# Patient Record
Sex: Male | Born: 1950 | Race: White | Hispanic: No | Marital: Married | Smoking: Never smoker
Health system: Southern US, Community
[De-identification: ages and names within clinical notes are randomized; demographics above are authoritative.]

## PROBLEM LIST (undated history)

## (undated) DIAGNOSIS — I10 Essential (primary) hypertension: Secondary | ICD-10-CM

## (undated) DIAGNOSIS — E7800 Pure hypercholesterolemia, unspecified: Secondary | ICD-10-CM

## (undated) HISTORY — PX: TONSILLECTOMY: SUR1361

---

## 2001-09-27 ENCOUNTER — Ambulatory Visit (HOSPITAL_BASED_OUTPATIENT_CLINIC_OR_DEPARTMENT_OTHER): Admission: RE | Admit: 2001-09-27 | Discharge: 2001-09-27 | Payer: Self-pay | Admitting: Plastic Surgery

## 2015-04-28 ENCOUNTER — Emergency Department (HOSPITAL_BASED_OUTPATIENT_CLINIC_OR_DEPARTMENT_OTHER)
Admission: EM | Admit: 2015-04-28 | Discharge: 2015-04-28 | Disposition: A | Discharge status: Home / Self Care | Attending: Emergency Medicine | Admitting: Emergency Medicine

## 2015-04-28 ENCOUNTER — Encounter (HOSPITAL_BASED_OUTPATIENT_CLINIC_OR_DEPARTMENT_OTHER): Payer: Self-pay

## 2015-04-28 DIAGNOSIS — I10 Essential (primary) hypertension: Secondary | ICD-10-CM | POA: Insufficient documentation

## 2015-04-28 DIAGNOSIS — S60512A Abrasion of left hand, initial encounter: Secondary | ICD-10-CM | POA: Insufficient documentation

## 2015-04-28 DIAGNOSIS — S4992XA Unspecified injury of left shoulder and upper arm, initial encounter: Secondary | ICD-10-CM | POA: Insufficient documentation

## 2015-04-28 DIAGNOSIS — Y998 Other external cause status: Secondary | ICD-10-CM | POA: Diagnosis not present

## 2015-04-28 DIAGNOSIS — M25512 Pain in left shoulder: Secondary | ICD-10-CM

## 2015-04-28 DIAGNOSIS — S6992XA Unspecified injury of left wrist, hand and finger(s), initial encounter: Secondary | ICD-10-CM | POA: Diagnosis present

## 2015-04-28 DIAGNOSIS — Y92008 Other place in unspecified non-institutional (private) residence as the place of occurrence of the external cause: Secondary | ICD-10-CM | POA: Diagnosis not present

## 2015-04-28 DIAGNOSIS — W1789XA Other fall from one level to another, initial encounter: Secondary | ICD-10-CM | POA: Diagnosis not present

## 2015-04-28 DIAGNOSIS — Y9389 Activity, other specified: Secondary | ICD-10-CM | POA: Insufficient documentation

## 2015-04-28 DIAGNOSIS — E780 Pure hypercholesterolemia: Secondary | ICD-10-CM | POA: Diagnosis not present

## 2015-04-28 HISTORY — DX: Essential (primary) hypertension: I10

## 2015-04-28 HISTORY — DX: Pure hypercholesterolemia, unspecified: E78.00

## 2015-04-28 NOTE — Discharge Instructions (Signed)
You may take the celebrex you have at home for your pain. Apply ice to your shoulder. Keep hand wound clean and dry.  Abrasion An abrasion is a cut or scrape of the skin. Abrasions do not extend through all layers of the skin and most heal within 10 days. It is important to care for your abrasion properly to prevent infection. CAUSES  Most abrasions are caused by falling on, or gliding across, the ground or other surface. When your skin rubs on something, the outer and inner layer of skin rubs off, causing an abrasion. DIAGNOSIS  Your caregiver will be able to diagnose an abrasion during a physical exam.  TREATMENT  Your treatment depends on how large and deep the abrasion is. Generally, your abrasion will be cleaned with water and a mild soap to remove any dirt or debris. An antibiotic ointment may be put over the abrasion to prevent an infection. A bandage (dressing) may be wrapped around the abrasion to keep it from getting dirty.  You may need a tetanus shot if:  You cannot remember when you had your last tetanus shot.  You have never had a tetanus shot.  The injury broke your skin. If you get a tetanus shot, your arm may swell, get red, and feel warm to the touch. This is common and not a problem. If you need a tetanus shot and you choose not to have one, there is a rare chance of getting tetanus. Sickness from tetanus can be serious.  HOME CARE INSTRUCTIONS   If a dressing was applied, change it at least once a day or as directed by your caregiver. If the bandage sticks, soak it off with warm water.   Wash the area with water and a mild soap to remove all the ointment 2 times a day. Rinse off the soap and pat the area dry with a clean towel.   Reapply any ointment as directed by your caregiver. This will help prevent infection and keep the bandage from sticking. Use gauze over the wound and under the dressing to help keep the bandage from sticking.   Change your dressing right away  if it becomes wet or dirty.   Only take over-the-counter or prescription medicines for pain, discomfort, or fever as directed by your caregiver.   Follow up with your caregiver within 24-48 hours for a wound check, or as directed. If you were not given a wound-check appointment, look closely at your abrasion for redness, swelling, or pus. These are signs of infection. SEEK IMMEDIATE MEDICAL CARE IF:   You have increasing pain in the wound.   You have redness, swelling, or tenderness around the wound.   You have pus coming from the wound.   You have a fever or persistent symptoms for more than 2-3 days.  You have a fever and your symptoms suddenly get worse.  You have a bad smell coming from the wound or dressing.  MAKE SURE YOU:   Understand these instructions.  Will watch your condition.  Will get help right away if you are not doing well or get worse. Document Released: 08/30/2005 Document Revised: 11/06/2012 Document Reviewed: 10/24/2011 [facility] Patient Information 2015 [place]. This information is not intended to replace advice given to you by your health care provider. Make sure you discuss any questions you have with your health care provider.   Shoulder Pain The shoulder is the joint that connects your arms to your body. The bones that form the shoulder  joint include the upper arm bone (humerus), the shoulder blade (scapula), and the collarbone (clavicle). The top of the humerus is shaped like a ball and fits into a rather flat socket on the scapula (glenoid cavity). A combination of muscles and strong, fibrous tissues that connect muscles to bones (tendons) support your shoulder joint and hold the ball in the socket. Small, fluid-filled sacs (bursae) are located in different areas of the joint. They act as cushions between the bones and the overlying soft tissues and help reduce friction between the gliding tendons and the bone as you move your arm. Your shoulder  joint allows a wide range of motion in your arm. This range of motion allows you to do things like scratch your back or throw a ball. However, this range of motion also makes your shoulder more prone to pain from overuse and injury. Causes of shoulder pain can originate from both injury and overuse and usually can be grouped in the following four categories:  Redness, swelling, and pain (inflammation) of the tendon (tendinitis) or the bursae (bursitis).  Instability, such as a dislocation of the joint.  Inflammation of the joint (arthritis).  Broken bone (fracture). HOME CARE INSTRUCTIONS   Apply ice to the sore area.  Put ice in a plastic bag.  Place a towel between your skin and the bag.  Leave the ice on for 15-20 minutes, 3-4 times per day for the first 2 days, or as directed by your health care provider.  Stop using cold packs if they do not help with the pain.  If you have a shoulder sling or immobilizer, wear it as long as your caregiver instructs. Only remove it to shower or bathe. Move your arm as little as possible, but keep your hand moving to prevent swelling.  Squeeze a soft ball or foam pad as much as possible to help prevent swelling.  Only take over-the-counter or prescription medicines for pain, discomfort, or fever as directed by your caregiver. SEEK MEDICAL CARE IF:   Your shoulder pain increases, or new pain develops in your arm, hand, or fingers.  Your hand or fingers become cold and numb.  Your pain is not relieved with medicines. SEEK IMMEDIATE MEDICAL CARE IF:   Your arm, hand, or fingers are numb or tingling.  Your arm, hand, or fingers are significantly swollen or turn white or blue. MAKE SURE YOU:   Understand these instructions.  Will watch your condition.  Will get help right away if you are not doing well or get worse. Document Released: 08/30/2005 Document Revised: 04/06/2014 Document Reviewed: 11/04/2011 [facility] Patient Information  2015 [place], [place]. This information is not intended to replace advice given to you by your health care provider. Make sure you discuss any questions you have with your health care provider.

## 2015-04-28 NOTE — ED Provider Notes (Signed)
CSN: [id number]     Arrival date & time 04/28/15  1452 History   First MD Initiated Contact with Patient 04/28/15 1500     Chief Complaint  Patient presents with  . Fall     (Consider location/radiation/quality/duration/timing/severity/associated sxs/prior Treatment) HPI Comments: 64 year old male complaining of left hand and left shoulder pain after falling 3 feet off of a porch about an hour and a half prior to arrival. Patient reports there is no railing on the porch, and he missed a step and accidentally fell. No head injury or loss of consciousness. States he fell onto gravel and needs his hand cleaned out. Other than the scrapes on his hand, denies hand pain. Shoulder pain is described as a soreness, "deep into his shoulder", 1/10 without movement, only mildly increasing with movement. He took a Celebrex at home with some relief of his pain. Denies numbness or tingling. No prior injury to the left shoulder. Reports an injury 15 years ago to his right shoulder. Last tetanus shot was within the past 5 years.  Patient is a 64 y.o. male presenting with fall. The history is provided by the patient.  Fall Pertinent negatives include no chest pain, fever or numbness.    Past Medical History  Diagnosis Date  . High cholesterol   . Hypertension    Past Surgical History  Procedure Laterality Date  . Tonsillectomy     No family history on file. History  Substance Use Topics  . Smoking status: Never Smoker   . Smokeless tobacco: Not on file  . Alcohol Use: No    Review of Systems  Constitutional: Negative for fever.  HENT: Negative.   Respiratory: Negative for shortness of breath.   Cardiovascular: Negative for chest pain.  Musculoskeletal:       + L shoulder pain.  Skin: Positive for wound.  Neurological: Negative for dizziness and numbness.      Allergies  Review of patient's allergies indicates no known allergies.  Home Medications   Prior to Admission medications    Medication Sig Start Date End Date Taking? Authorizing Provider  Nebivolol HCl (BYSTOLIC PO) Take by mouth.   Yes Historical Provider, MD  SIMVASTATIN PO Take by mouth.   Yes Historical Provider, MD   BP 131/75 mmHg  Pulse 71  Temp(Src) 97.9 F (36.6 C)  Resp 18  Ht 5\' 10"  (1.778 m)  Wt 165 lb (74.844 kg)  BMI 23.68 kg/m2  SpO2 97% Physical Exam  Constitutional: He is oriented to person, place, and time. He appears well-developed and well-nourished. No distress.  HENT:  Head: Normocephalic and atraumatic.  Eyes: Conjunctivae and EOM are normal.  Neck: Normal range of motion. Neck supple.  Cardiovascular: Normal rate, regular rhythm and normal heart sounds.   Pulmonary/Chest: Effort normal and breath sounds normal.  Musculoskeletal: Normal range of motion. He exhibits no edema.  L shoulder non-tender. No swelling or deformity. FROM without pain. Positive empty can sign. L hand with abrasions to palmar surface. No active bleeding. No FB seen or palpated. No tenderness. FROM L hand. Cap refill < 2 seconds. L wrist non-tender.  Neurological: He is alert and oriented to person, place, and time.  Skin: Skin is warm and dry.  Psychiatric: He has a normal mood and affect. His behavior is normal.  Nursing note and vitals reviewed.   ED Course  Procedures (including critical care time) Labs Review Labs Reviewed - No data to display  Imaging Review No results found.  EKG Interpretation None      MDM   Final diagnoses:  Hand abrasion, left, initial encounter  Left shoulder pain   Neurovascularly intact. No FB seen or palpated. No bony tenderness. I do not feel imaging studies necessary at this time. FROM. Possible rotator cuff injury or inflammation. Has FROM shoulder and hand without pain. Advised him to continue celebrex, ice and f/u with PCP and/or sports medicine. Stable for d/c. Return precautions given. Patient states understanding of treatment care plan and is  agreeable.  Kathrynn Speed, PA-C 04/28/15 1527  Zadie Rhine, MD 04/29/15 2229

## 2015-04-28 NOTE — ED Notes (Signed)
Fell off porch today-pain to left shoulder and left hand

## 2015-07-21 ENCOUNTER — Ambulatory Visit: Attending: Orthopaedic Surgery | Admitting: Physical Therapy

## 2015-07-21 ENCOUNTER — Encounter: Payer: Self-pay | Admitting: Physical Therapy

## 2015-07-21 DIAGNOSIS — M25612 Stiffness of left shoulder, not elsewhere classified: Secondary | ICD-10-CM | POA: Diagnosis present

## 2015-07-21 DIAGNOSIS — M25512 Pain in left shoulder: Secondary | ICD-10-CM

## 2015-07-21 NOTE — Therapy (Signed)
[facility] [address] [place], [place], [address] Phone: [phone]   Fax:  [phone]  Physical Therapy Evaluation  Patient Details  Name: Arthur Waters MRN: [id number] Date of Birth: [birth year] Referring Provider:  Valeria Batman, MD  Encounter Date: 07/21/2015      PT End of Session - 07/21/15 1005    Visit Number 1   PT Start Time [phone]   PT Stop Time 0930   PT Time Calculation (min) 48 min   Activity Tolerance Patient tolerated treatment well   Behavior During Therapy [facility] for tasks assessed/performed      Past Medical History  Diagnosis Date  . High cholesterol   . Hypertension     Past Surgical History  Procedure Laterality Date  . Tonsillectomy      There were no vitals filed for this visit.  Visit Diagnosis:  Left shoulder pain - Plan: PT plan of care cert/re-cert  Shoulder stiffness, left - Plan: PT plan of care cert/re-cert      Subjective Assessment - 07/21/15 1000    Subjective reports a fall May 25th, injured his left shoulder, x-rays negative.  Had a cortisone injection and overall has less pain but still pain and limited ROM   Patient Stated Goals limits left shoulder use due to pain   Currently in Pain? Yes   Pain Score 3    Pain Location Shoulder   Pain Orientation Left;Anterior   Pain Descriptors / Indicators Aching;Sharp   Pain Type Acute pain   Pain Onset More than a month ago   Pain Frequency Intermittent   Aggravating Factors  push ups and behind the head activity   Pain Relieving Factors rest   Effect of Pain on Daily Activities limits left shoulder use            [facility] PT Assessment - 07/21/15 0001    Assessment   Medical Diagnosis left shoulder impingement   Onset Date/Surgical Date 04/28/15   Hand Dominance Right   Precautions   Precautions None   Balance Screen   Has the patient fallen in the past 6 months No   Has the patient had a decrease in  activity level because of a fear of falling?  No   Is the patient reluctant to leave their home because of a fear of falling?  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, has to do pushups often   Posture/Postural Control   Posture Comments good, slight rounded shoulders   AROM   Left Shoulder Flexion 140 Degrees   Left Shoulder ABduction 115 Degrees   Left Shoulder Internal Rotation 65 Degrees   Left Shoulder External Rotation 15 Degrees   Strength   Overall Strength Comments 4+/5 with minimal pain   Palpation   Palpation comment mild tenderness anterior /lateral left shoulder   Special Tests    Special Tests --  negative empty can, mild pain iwth impingement tests                           PT Education - 07/21/15 1004    Education provided Yes   Education Details corner stretch, star gazer stretch, went over safe use of equipment at gym as he was doing behind the head exercises   Person(s) Educated Patient   Methods  Explanation;Demonstration;Handout   Comprehension Verbalized understanding;Returned demonstration          PT Short Term Goals - 07/21/15 1221    PT SHORT TERM GOAL #1   Title independent with initial HEP   Time 1   Period Days   Status Achieved           PT Long Term Goals - 07/21/15 1222    PT LONG TERM GOAL #1   Title decrease pain to no pain with ADL's   Time 8   Period Weeks   Status New   PT LONG TERM GOAL #2   Title increase abduction to 130 degrees and ER to 60 degrees   Time 8   Period Weeks   Status New               Plan - 07/21/15 1219    Clinical Impression Statement Patient with a fall onto the shoulder in May, reports pain is better but he is missing ROM and having some increased difficulty with ADL's, x-rays negative.  Very tight, limited ER and abduction.  Good strength   Pt will benefit from  skilled therapeutic intervention in order to improve on the following deficits Decreased range of motion;Decreased strength;Impaired UE functional use;Pain   Rehab Potential Good   PT Frequency 1x / week   PT Duration 8 weeks   PT Treatment/Interventions Cryotherapy;Electrical Stimulation;Iontophoresis /ml Dexamethasone;Moist Heat;Ultrasound;Therapeutic exercise;Therapeutic activities;Patient/family education;Manual techniques   PT Next Visit Plan Patient is very active and travels, he is to try the HEP, may return in 1 month   Consulted and Agree with Plan of Care Patient         Problem List There are no active problems to display for this patient.   Jearld Lesch., PT 07/21/2015, 12:24 PM  [facility] [address] [place], [place], [address] Phone: [phone]   Fax:  [phone]

## 2016-09-24 ENCOUNTER — Emergency Department (HOSPITAL_BASED_OUTPATIENT_CLINIC_OR_DEPARTMENT_OTHER)

## 2016-09-24 ENCOUNTER — Encounter (HOSPITAL_BASED_OUTPATIENT_CLINIC_OR_DEPARTMENT_OTHER): Payer: Self-pay | Admitting: Emergency Medicine

## 2016-09-24 ENCOUNTER — Emergency Department (HOSPITAL_BASED_OUTPATIENT_CLINIC_OR_DEPARTMENT_OTHER)
Admission: EM | Admit: 2016-09-24 | Discharge: 2016-09-24 | Disposition: A | Discharge status: Home / Self Care | Attending: Emergency Medicine | Admitting: Emergency Medicine

## 2016-09-24 DIAGNOSIS — I10 Essential (primary) hypertension: Secondary | ICD-10-CM | POA: Diagnosis not present

## 2016-09-24 DIAGNOSIS — Z79899 Other long term (current) drug therapy: Secondary | ICD-10-CM | POA: Diagnosis not present

## 2016-09-24 DIAGNOSIS — I824Z1 Acute embolism and thrombosis of unspecified deep veins of right distal lower extremity: Secondary | ICD-10-CM

## 2016-09-24 DIAGNOSIS — I82401 Acute embolism and thrombosis of unspecified deep veins of right lower extremity: Secondary | ICD-10-CM | POA: Insufficient documentation

## 2016-09-24 DIAGNOSIS — M79661 Pain in right lower leg: Secondary | ICD-10-CM | POA: Diagnosis present

## 2016-09-24 LAB — COMPREHENSIVE METABOLIC PANEL
ALK PHOS: 58 U/L (ref 38–126)
ALT: 18 U/L (ref 17–63)
AST: 21 U/L (ref 15–41)
Albumin: 4.5 g/dL (ref 3.5–5.0)
Anion gap: 10 (ref 5–15)
BILIRUBIN TOTAL: 0.4 mg/dL (ref 0.3–1.2)
BUN: 22 mg/dL — ABNORMAL HIGH (ref 6–20)
CO2: 26 mmol/L (ref 22–32)
CREATININE: 1.14 mg/dL (ref 0.61–1.24)
Calcium: 9.4 mg/dL (ref 8.9–10.3)
Chloride: 104 mmol/L (ref 101–111)
GFR calc non Af Amer: >60 mL/min (ref >60)
Glucose, Bld: 99 mg/dL (ref 65–99)
Potassium: 3.5 mmol/L (ref 3.5–5.1)
SODIUM: 140 mmol/L (ref 135–145)
Total Protein: 7.6 g/dL (ref 6.5–8.1)

## 2016-09-24 LAB — CBC WITH DIFFERENTIAL/PLATELET
Basophils Absolute: 0 10*3/uL (ref 0.0–0.1)
Basophils Relative: 1 %
EOS ABS: 0.2 10*3/uL (ref 0.0–0.7)
Eosinophils Relative: 2 %
HEMATOCRIT: 40.7 % (ref 39.0–52.0)
HEMOGLOBIN: 13.8 g/dL (ref 13.0–17.0)
LYMPHS ABS: 1.4 10*3/uL (ref 0.7–4.0)
LYMPHS PCT: 21 %
MCH: 30.8 pg (ref 26.0–34.0)
MCHC: 33.9 g/dL (ref 30.0–36.0)
MCV: 90.8 fL (ref 78.0–100.0)
Monocytes Absolute: 0.9 10*3/uL (ref 0.1–1.0)
Monocytes Relative: 14 %
NEUTROS ABS: 4.1 10*3/uL (ref 1.7–7.7)
NEUTROS PCT: 62 %
Platelets: 164 10*3/uL (ref 150–400)
RBC: 4.48 MIL/uL (ref 4.22–5.81)
RDW: 13.1 % (ref 11.5–15.5)
WBC: 6.6 10*3/uL (ref 4.0–10.5)

## 2016-09-24 MED ORDER — RIVAROXABAN (XARELTO) VTE STARTER PACK (15 & 20 MG): 15.0000 mg | ORAL_TABLET | ORAL | 0 refills | Status: AC

## 2016-09-24 MED ORDER — RIVAROXABAN (XARELTO) VTE STARTER PACK (15 & 20 MG): 15.0000 mg | ORAL_TABLET | ORAL | 0 refills | Status: DC

## 2016-09-24 NOTE — ED Provider Notes (Addendum)
[facility] Provider Note   CSN: [id number] Arrival date & time: 09/24/16  1633  By signing my name below, I, Arthur Waters, attest that this documentation has been prepared under the direction and in the presence of Alvira MondayErin Aydien Majette, MD . Electronically Signed: Teofilo PodMatthew P. Waters, ED Scribe. 09/24/2016. 6:00 PM.   History   Chief Complaint Chief Complaint  Patient presents with  . Leg Pain    The history is provided by the patient. No language interpreter was used.   HPI Comments:  Arthur Waters is a 65 y.o. male with PMHx of HLD and HTN who presents to the Emergency Department complaining of constant right foot and lower leg pain x 1 week. Pt states that 1 week ago [place] and noticed that his right foot was tender after the flight. Pt rates the pain at 2/10. Pt notes that the pain is alleviated by taking hot showers. No other alleviating factors noted. Pt denies chest pain, SOB, fever. No hx of DVT, recent surgeries.   Past Medical History:  Diagnosis Date  . High cholesterol   . Hypertension     There are no active problems to display for this patient.   Past Surgical History:  Procedure Laterality Date  . TONSILLECTOMY         Home Medications    Prior to Admission medications   Medication Sig Start Date End Date Taking? Authorizing Provider  Nebivolol HCl (BYSTOLIC PO) Take by mouth.    Historical Provider, MD  Rivaroxaban 15 & 20 MG TBPK Take 15 mg by mouth See admin instructions. Take as directed on package: Start with one 15mg  tablet by mouth twice a day with food. On Day 22, switch to one 20mg  tablet once a day with food. 09/24/16   Alvira MondayErin Catheleen Langhorne, MD  SIMVASTATIN PO Take by mouth.    Historical Provider, MD    Family History History reviewed. No pertinent family history.  Social History Social History  Substance Use Topics  . Smoking status: Never Smoker  . Smokeless tobacco: Not on file    . Alcohol use No     Allergies   Review of patient's allergies indicates no known allergies.   Review of Systems Review of Systems  Constitutional: Negative for fever.  HENT: Negative for sore throat.   Eyes: Negative for visual disturbance.  Respiratory: Negative for shortness of breath.   Cardiovascular: Negative for chest pain.  Gastrointestinal: Negative for abdominal pain.  Genitourinary: Negative for difficulty urinating.  Musculoskeletal: Positive for arthralgias and myalgias. Negative for back pain and neck stiffness.  Skin: Negative for rash.  Neurological: Negative for syncope and headaches.  All other systems reviewed and are negative.    Physical Exam Updated Vital Signs BP 140/69 (BP Location: Right Arm)   Pulse 68   Temp 98.5 F (36.9 C) (Oral)   Resp 18   Ht 5\' 11"  (1.803 m)   Wt 175 lb (79.4 kg)   SpO2 97%   BMI 24.41 kg/m   Physical Exam  Constitutional: He is oriented to person, place, and time. He appears well-developed and well-nourished. No distress.  HENT:  Head: Normocephalic and atraumatic.  Eyes: Conjunctivae and EOM are normal.  Neck: Normal range of motion.  Cardiovascular: Normal rate, regular rhythm, normal heart sounds and intact distal pulses.  Exam reveals no gallop and no friction rub.   No murmur heard. Pulmonary/Chest: Effort normal and breath sounds normal. No  respiratory distress. He has no wheezes. He has no rales.  Abdominal: Soft. He exhibits no distension. There is no tenderness. There is no guarding.  Musculoskeletal: He exhibits no edema.       Right ankle: He exhibits swelling (around ankle, posterior to medial malleolus). He exhibits normal range of motion.       Right lower leg: He exhibits tenderness.       Right foot: There is tenderness (midfoot).  Neurological: He is alert and oriented to person, place, and time.  Skin: Skin is warm and dry. He is not diaphoretic.  Psychiatric: He has a normal mood and affect.   Nursing note and vitals reviewed.    ED Treatments / Results  DIAGNOSTIC STUDIES:  Oxygen Saturation is 98% on RA, normal by my interpretation.    COORDINATION OF CARE:  6:00 PM Discussed treatment plan with pt at bedside and pt agreed to plan.   Labs (all labs ordered are listed, but only abnormal results are displayed) Labs Reviewed  COMPREHENSIVE METABOLIC PANEL - Abnormal; Notable for the following:       Result Value   BUN 22 (*)    All other components within normal limits  CBC WITH DIFFERENTIAL/PLATELET    EKG  EKG Interpretation None       Radiology No results found.  Procedures Procedures (including critical care time)  Medications Ordered in ED Medications - No data to display   Initial Impression / Assessment and Plan / ED Course  I have reviewed the triage vital signs and the nursing notes.  Pertinent labs & imaging results that were available during my care of the patient were reviewed by me and considered in my medical decision making (see chart for details).  Clinical Course   65 year old male with a history of hypertension, hypercholesterolemia resents with concern for right foot and lower leg pain. Patient was on a flight the last approximately an hour, noted pain after this. Patient has good arterial DP and PT pulses, doubt acute arterial occlusion. No sign of infection. Ultrasound shows no DVT in the calf veins, however concern for DVT in the posterior tibial vein extending into the foot. Given patient with distal DVT, no other risk factors for bleeding, we'll initiate Xarelto. Obtained baseline laboratory work. Recommend PCP follow-up for continued monitoring of DVT, as well as possible hypercoagulable workup. Patient discharged in stable condition with understanding of reasons to return.   Final Clinical Impressions(s) / ED Diagnoses   Final diagnoses:  Acute deep vein thrombosis (DVT) of distal end of right lower extremity (HCC)    New  Prescriptions New Prescriptions   RIVAROXABAN 15 & 20 MG TBPK    Take 15 mg by mouth See admin instructions. Take as directed on package: Start with one 15mg  tablet by mouth twice a day with food. On Day 22, switch to one 20mg  tablet once a day with food.  I personally performed the services described in this documentation, which was scribed in my presence. The recorded information has been reviewed and is accurate.       Alvira Monday, MD 09/25/16 1325

## 2016-09-24 NOTE — ED Triage Notes (Signed)
Pt in c/o R foot and lower leg pain onset 1 week ago after long flight. Pt concerned for DVT. Alert, interactive, ambulatory in NAD.

## 2016-09-24 NOTE — ED Notes (Signed)
Patient presents with right lower leg and right ankle pain x1 week.  He recently had a 1 hour flight and noticed the pain afterwards.  Patient denies trauma to leg.  Patient denies SOB, chest pain, nausea and fever.  Patient states he walked a mile today with minimal right calf pain, but noticed some swelling to right medial ankle and decided to come be evaluated.  No rubor or deformity noted to RLE.  Mild swelling noted to right medial ankle.  Patient has increased right posterior calf pain with flexion of right ankle.

## 2017-09-17 IMAGING — US US EXTREM LOW VENOUS*R*
1 series · 13 of 24 positions shown · non-contrast
Comparison: None.

CLINICAL DATA: 65-year-old male with right foot pain.



[Series 1: us extrem low venous*right* · 0.07mm/px · 13 of 31 slices shown]
[im 1/31]
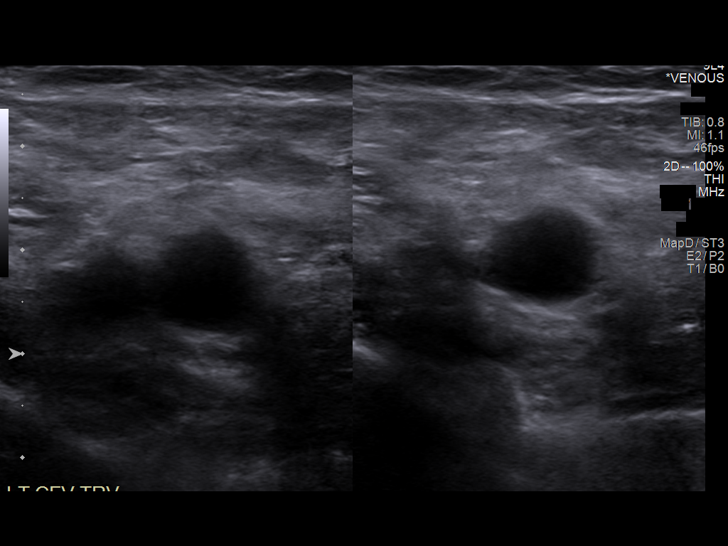
[im 3/31]
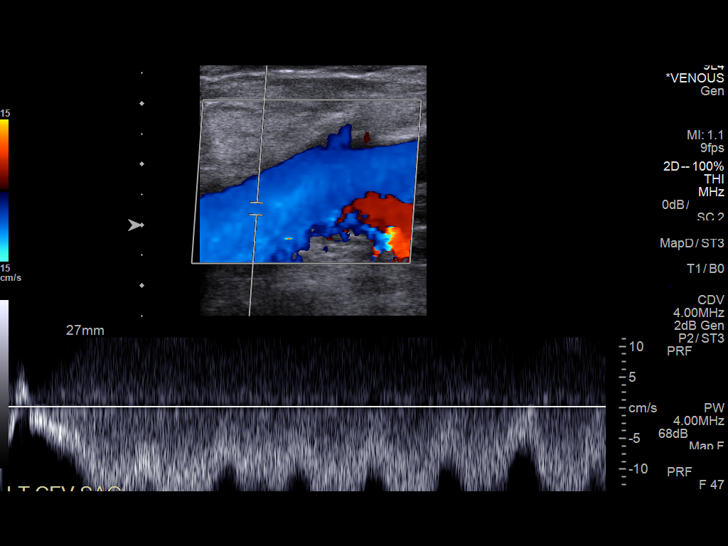
[im 6/31]
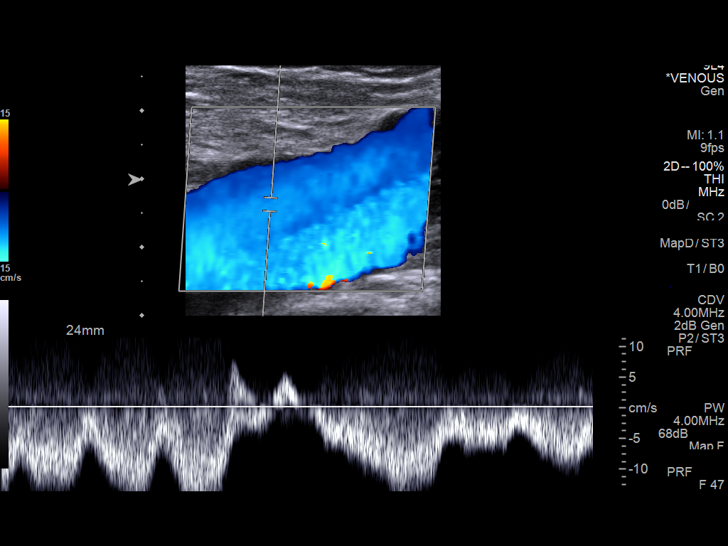
[im 8/31]
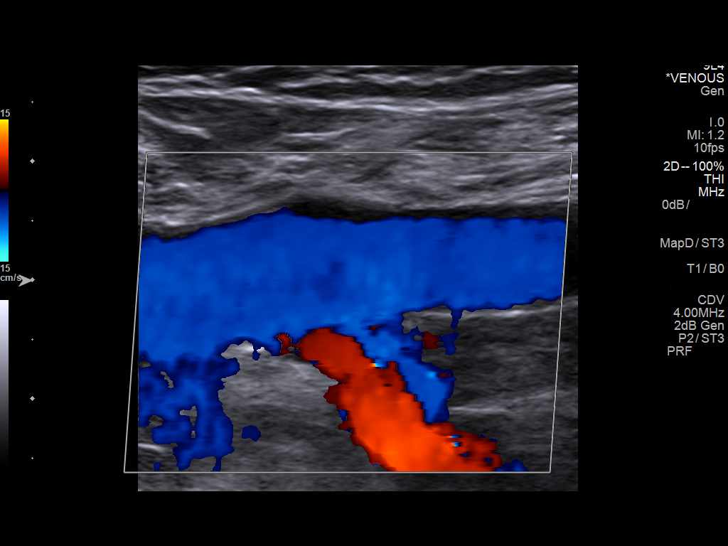
[im 11/31]
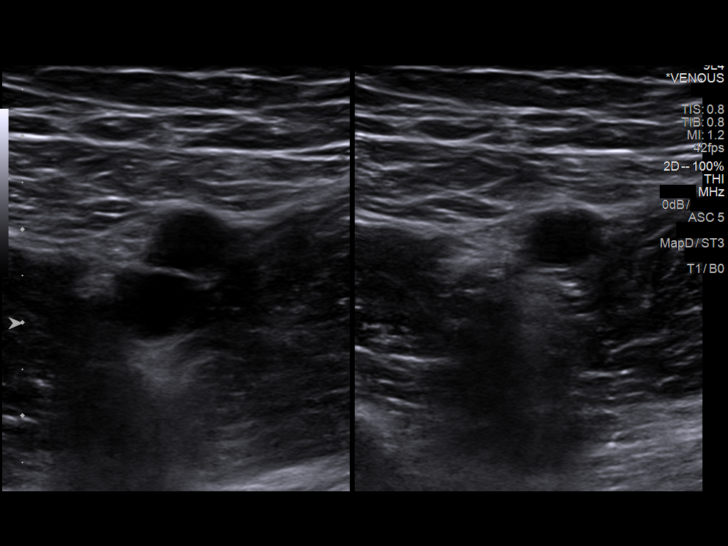
[im 14/31]
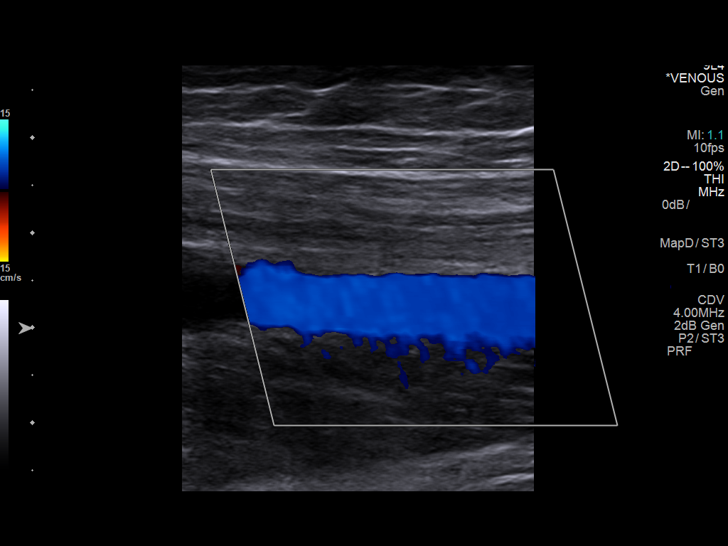
[im 16/31]
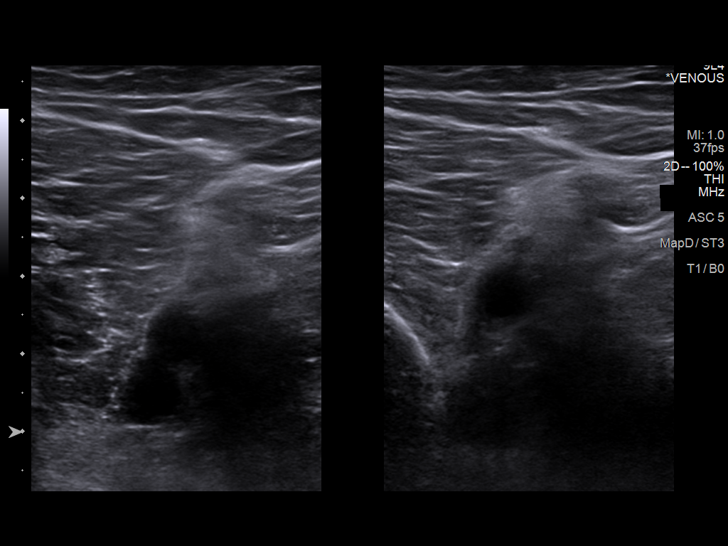
[im 17/31]
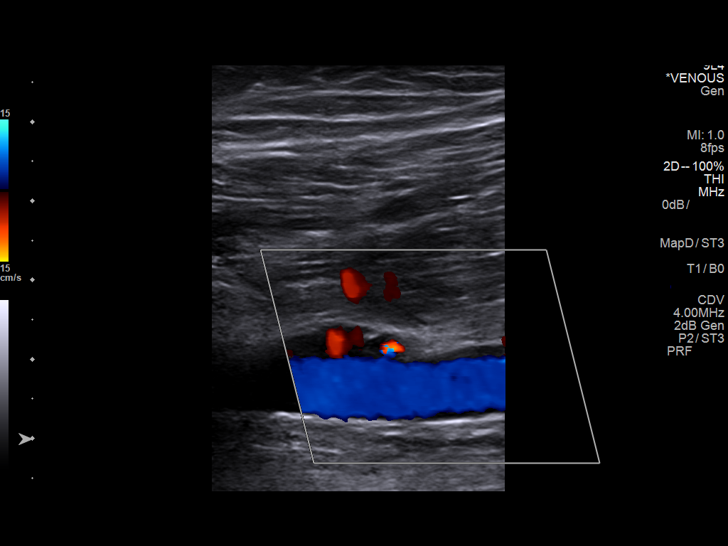
[im 20/31]
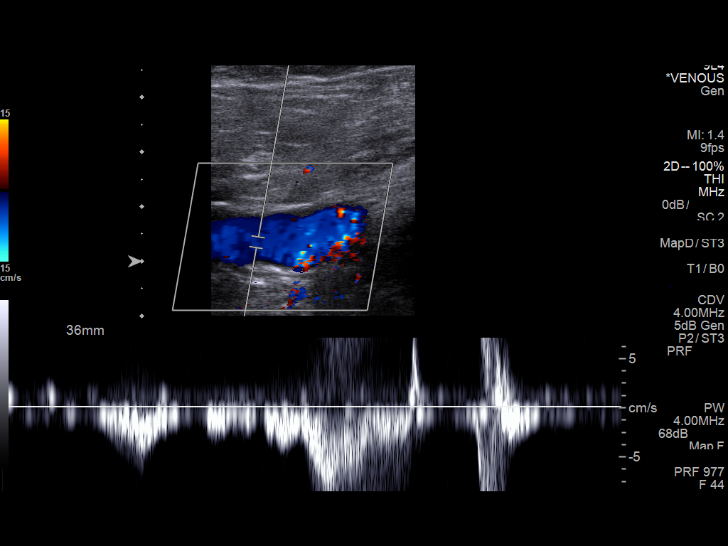
[im 23/31]
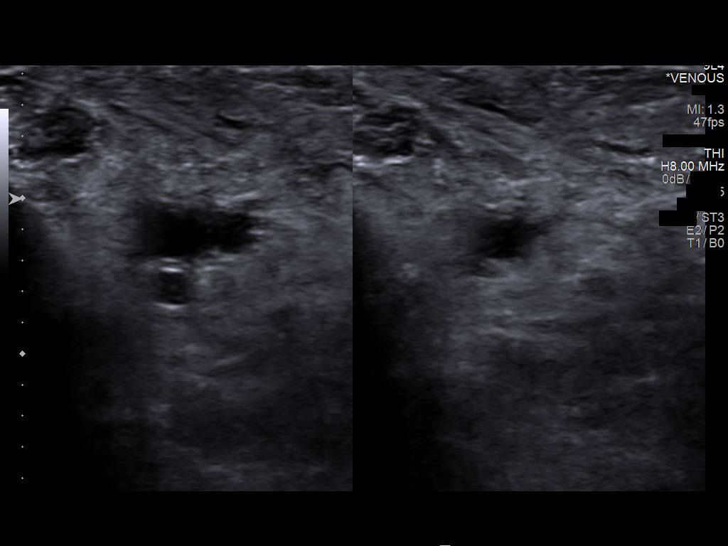
[im 25/31]
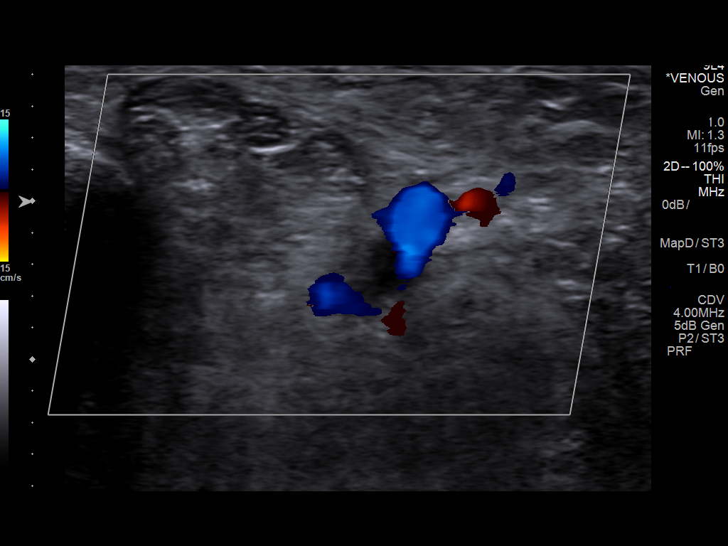
[im 28/31]
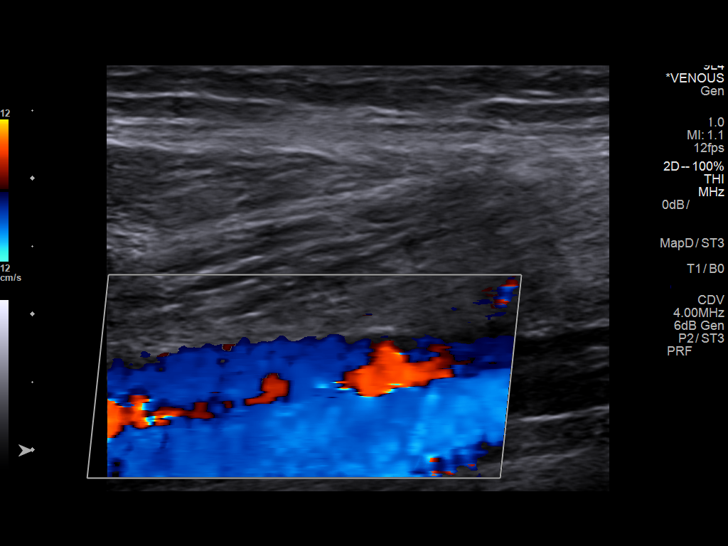
[im 31/31]
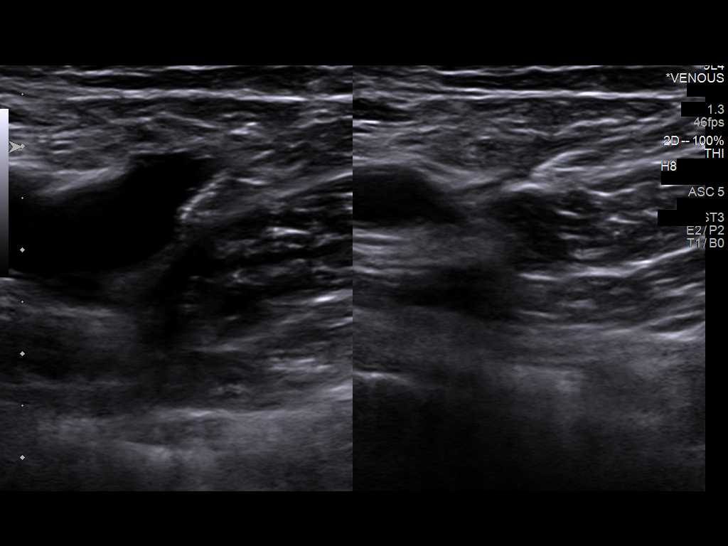

[13 of 24 positions shown; findings below may reference images not displayed]

FINDINGS: Contralateral Common Femoral Vein: Respiratory phasicity is normal
and symmetric with the symptomatic side. No evidence of thrombus.
Normal compressibility.

Common Femoral Vein: No evidence of thrombus. Normal
compressibility, respiratory phasicity and response to augmentation.

Saphenofemoral Junction: No evidence of thrombus. Normal
compressibility and flow on color Doppler imaging.

Profunda Femoral Vein: No evidence of thrombus. Normal
compressibility and flow on color Doppler imaging.

Femoral Vein: No evidence of thrombus. Normal compressibility,
respiratory phasicity and response to augmentation.

Popliteal Vein: No evidence of thrombus. Normal compressibility,
respiratory phasicity and response to augmentation.

Calf Veins: There is occlusive thrombus within the posterior tibial
vein at the level of the ankle and medial malleolus extending into
the plantar vein. The remainder of the visualized calf vessels
appear patent.

Superficial Great Saphenous Vein: No evidence of thrombus. Normal
compressibility and flow on color Doppler imaging.

Venous Reflux:  None.

Other Findings:  None.
IMPRESSION: Deep venous thrombosis involving the posterior tibial vein at the
level of the ankle.

These results were called by telephone at the time of interpretation
on 09/25/2016 at [DATE] to physician Giselle Barden who verbally
acknowledged these results.
